# Patient Record
Sex: Male | Born: 1995 | Race: White | Hispanic: No | Marital: Single | Smoking: Never smoker
Health system: Southern US, Community
[De-identification: ages and names within clinical notes are randomized; demographics above are authoritative.]

## PROBLEM LIST (undated history)

## (undated) DIAGNOSIS — R569 Unspecified convulsions: Secondary | ICD-10-CM

---
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
## 2016-01-14 ENCOUNTER — Ambulatory Visit: Payer: BLUE CROSS/BLUE SHIELD | Admitting: Nurse Practitioner

## 2016-01-15 ENCOUNTER — Ambulatory Visit: Payer: BLUE CROSS/BLUE SHIELD | Admitting: Nurse Practitioner

## 2016-01-16 ENCOUNTER — Encounter: Payer: Self-pay | Admitting: Nurse Practitioner

## 2016-01-20 ENCOUNTER — Other Ambulatory Visit: Payer: Self-pay | Admitting: Neurology

## 2016-01-20 ENCOUNTER — Telehealth: Payer: Self-pay | Admitting: *Deleted

## 2016-01-20 NOTE — Telephone Encounter (Signed)
Called pt . Spoke to mother d/t message I received from pt re no show appt on 7/19. She said she would make appt. Pt thought appt was scheduled for 7/26 and received a no show letter. He stated he was told appt was for 7/26, not 7/19. I offered appt on 7/26 at 230, 8/3 at 12pm or 430pm. Mother declined stating pt had to work. I made appt for 02/06/16 at 730am. Mother requested earliest appt possible d/t pt work. I advised we are not going to charge pt 50 dollar no show fee since it was a mis-communication. She verbalized understanding.

## 2016-01-26 IMAGING — DX DG RIBS W/ CHEST 3+V*R*
4 series · 4 of 4 positions shown · non-contrast
Comparison: None.

CLINICAL DATA: 18-year-old male with chest pain

EXAM:
RIGHT RIBS AND CHEST - 3+ VIEW

[chest pa]
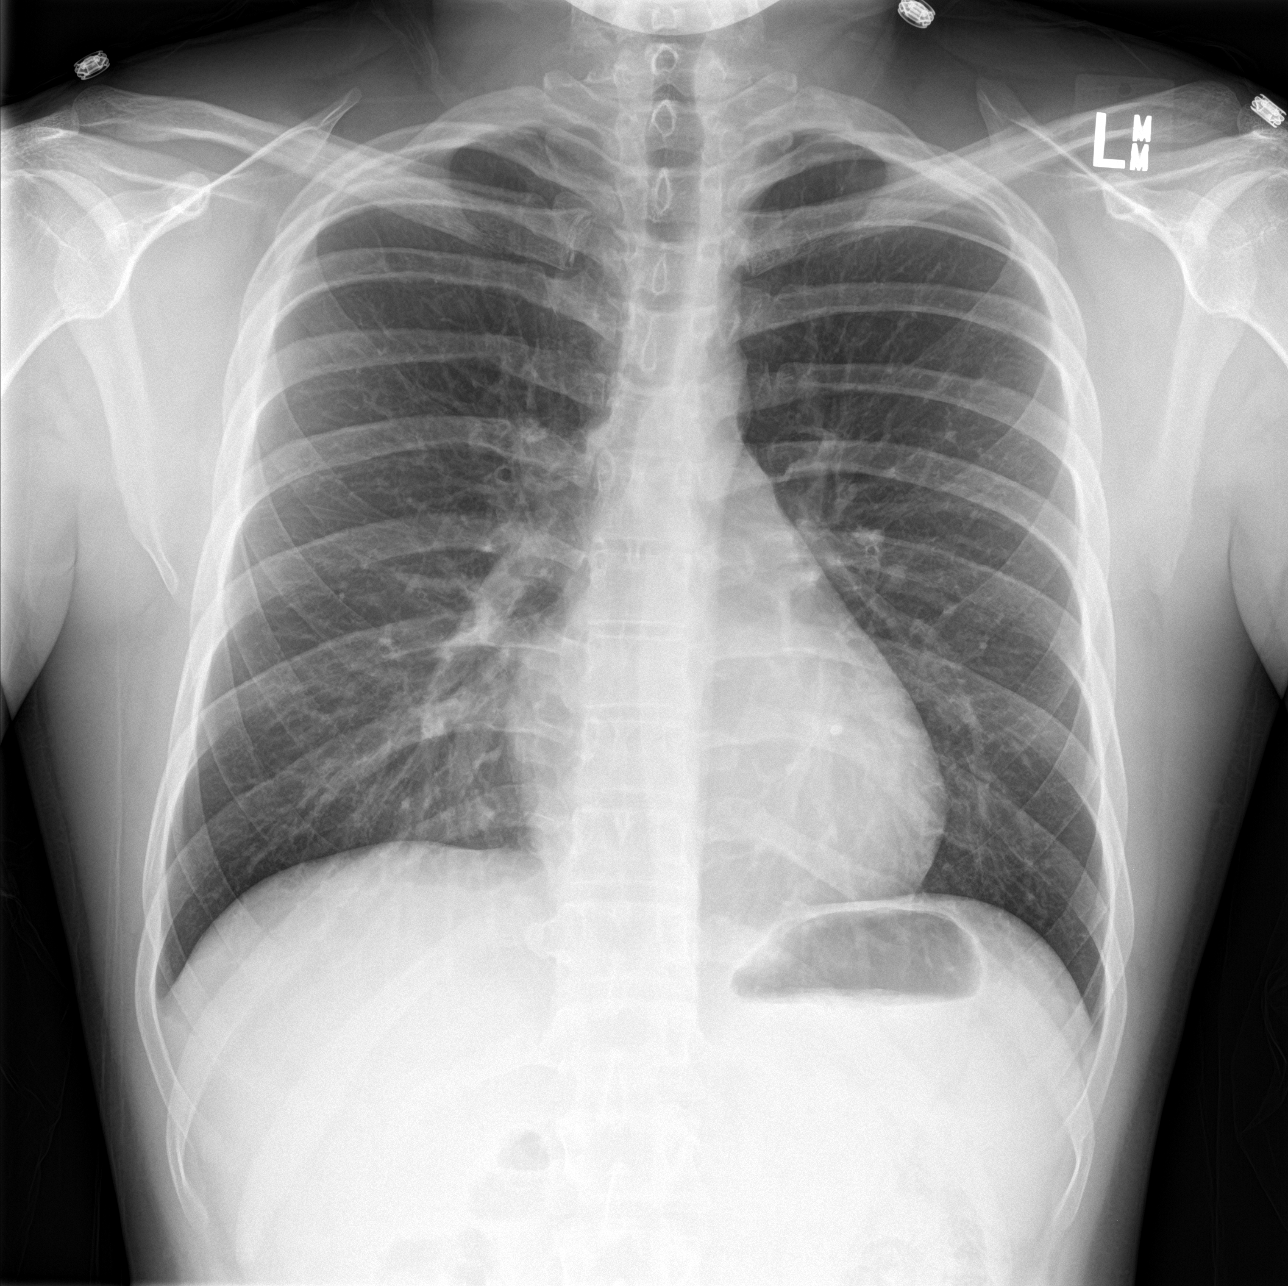

[rib pa]
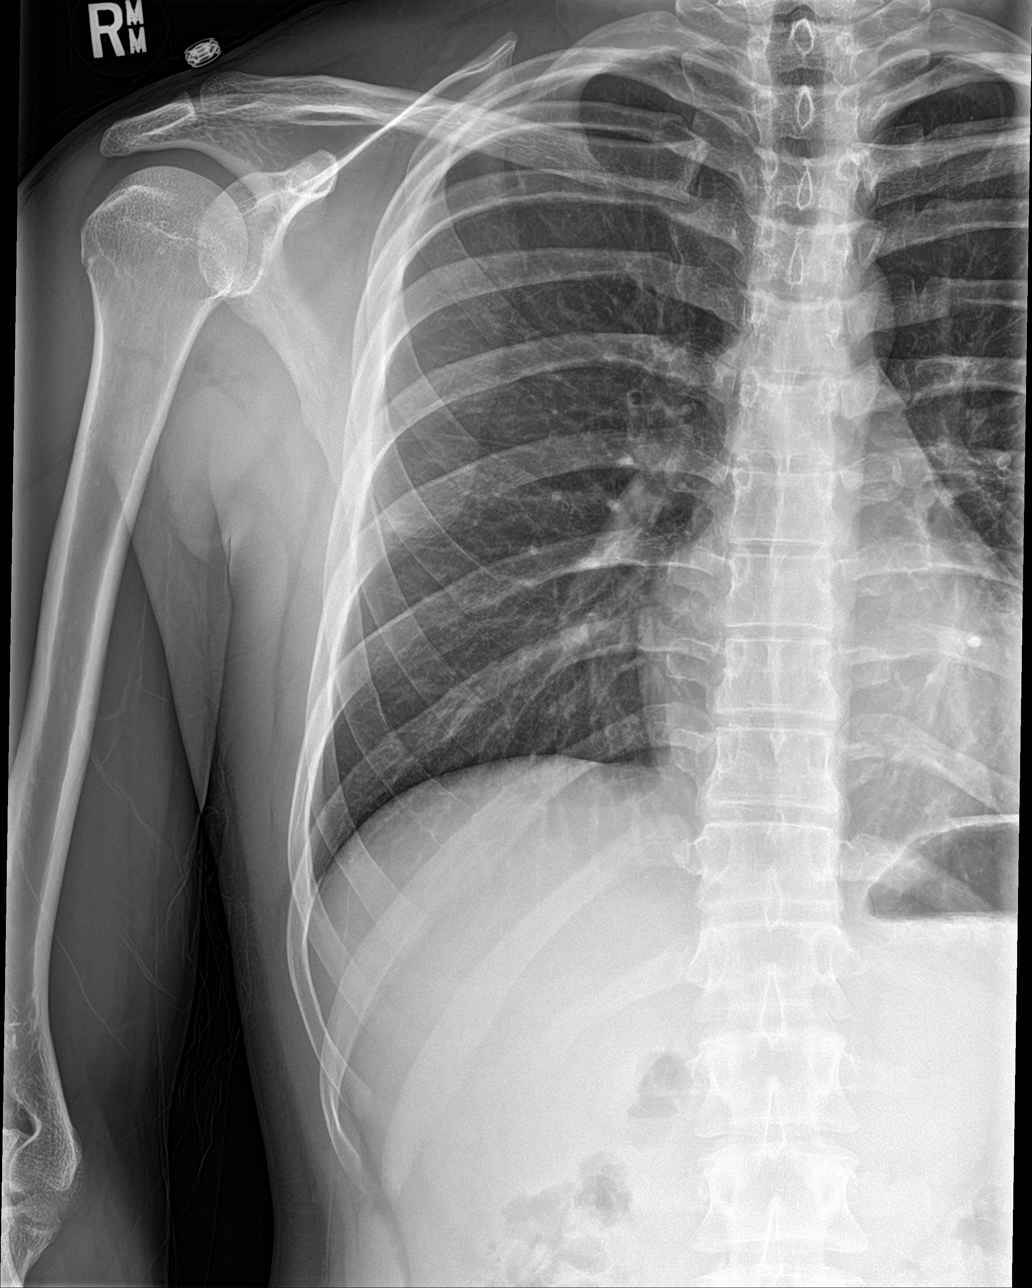

[rib pa obl (1 of 2)]
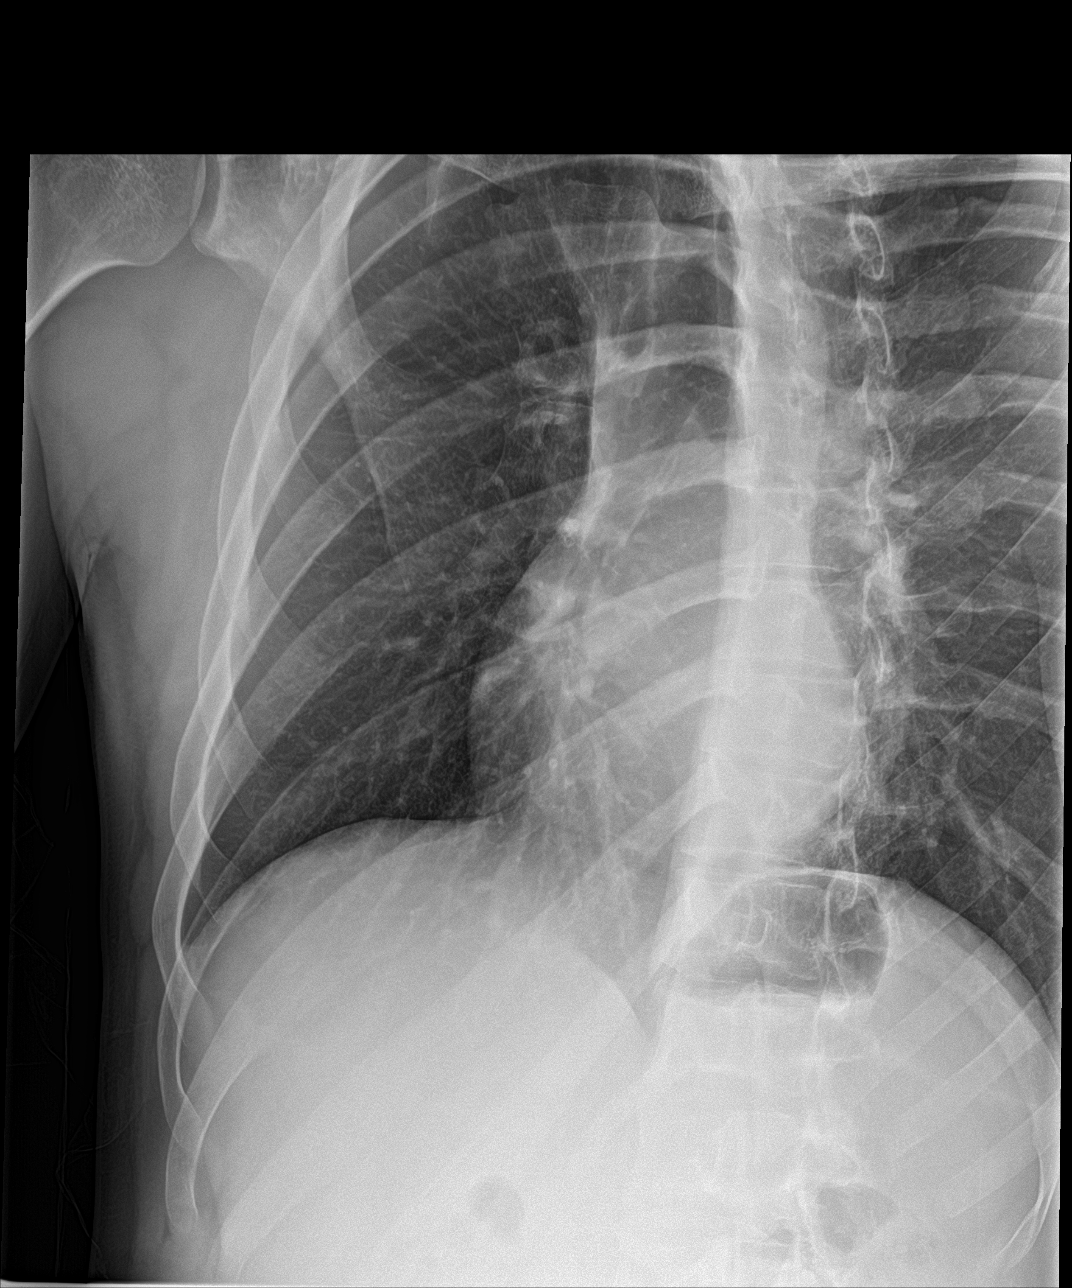

[rib pa obl (2 of 2)]
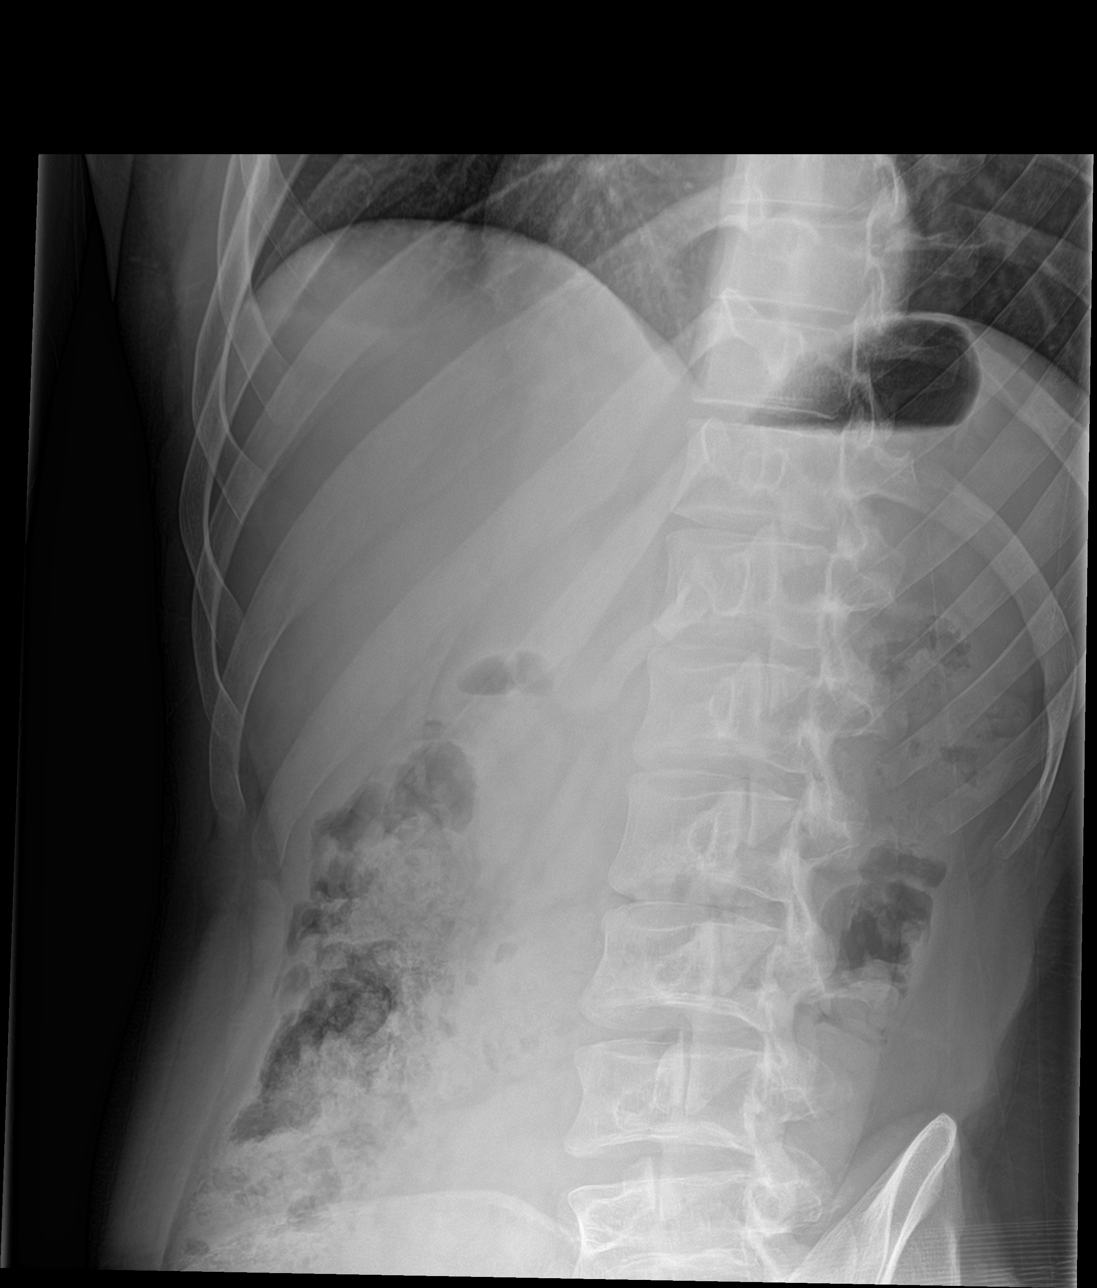

[4 of 4 positions shown; findings below may reference images not displayed]

FINDINGS: No fracture or other bone lesions are seen involving the ribs. There
is no evidence of pneumothorax or pleural effusion. Both lungs are
clear. Heart size and mediastinal contours are within normal limits.
IMPRESSION: Negative.

## 2016-02-06 ENCOUNTER — Ambulatory Visit (INDEPENDENT_AMBULATORY_CARE_PROVIDER_SITE_OTHER): Payer: BLUE CROSS/BLUE SHIELD | Admitting: Neurology

## 2016-02-06 ENCOUNTER — Encounter: Payer: Self-pay | Admitting: Neurology

## 2016-02-06 VITALS — BP 130/68 | HR 55 | Ht 68.0 in | Wt 190.2 lb

## 2016-02-06 DIAGNOSIS — R569 Unspecified convulsions: Secondary | ICD-10-CM | POA: Diagnosis not present

## 2016-02-06 DIAGNOSIS — Z79899 Other long term (current) drug therapy: Secondary | ICD-10-CM

## 2016-02-06 MED ORDER — LEVETIRACETAM ER 500 MG PO TB24: 1000.0000 mg | ORAL_TABLET | Freq: Every day | ORAL | 5 refills | Status: DC

## 2016-02-06 NOTE — Progress Notes (Signed)
[name]    Provider:  Dr James Harrell Referring Provider: Alycia Rossetti, MD Primary Care Physician:  James Blackbird, MD  CC:  seizure  Interval history: He is non compliant. He can drive September 62IR. Reviewed EEG and MRI images with patient and mother which were both normal. Discussed possibly getting a three-day EEG at this time we'll hold off. We'll repeat routine EEG in the office at next appointment. Episodes may have been provoked, patient was "vaping a lot" previous to episodes. Vaping has become very popular. Discussed that this may have been a provoking factor, I'm really unclear what is in this type of smoking supplies. Would prefer to keep patient on medication for 2 years, monitor, and discussed that a later time the possibility of taking him off the medications  HPI:  James Harrell is a 20 y.o. male here as a referral from Dr. Buelah Harrell for 2 episodes of seizure like activity. He was started on Keppra in the ED after the second event. On February 28th he passed out, he remembers everything, was not confused, but mother was called and said son had a seizure. They were about to close. He felt lightheaded, he had not eaten or drank anything that day. Mother talked to his boss and a fireman thought is looked like a seizure but no other details. He does not remember anything about the second one. They were about to close, then doesn't remember anything. He rememeber waking up in the hospital. He was found behind the counter. He was shaking  A little, eyes were open and one pupil looked bigger. Mom met him in the emergency room about 30 minutes after the incident. He seemed confused. He urinated on himself the second time. No FHx of seizures. Junior in [place], doing well, no developmental issues. No drug or alcohol use. He was started on Keppra in the ED. No preceding illnesses, nothing unusual, no new medications, never had jerking or staring spells. He has been vaping  lately.  Reviewed notes, labs and imaging from outside physicians, which showed: Was seen in the ED on 2/28. Pt states he began feeling dizzy just before leaving work. He was told that he passed out and became unresponsive, and was told by his manager that he was shaking. He states that when he woke up he knew where he was and was not confused. He denies feeling hot before or after the episode. He denies any medical problems and is not on any medications. He denies a PMHx of seizures as well as alcohol or drug use. Brought in again 3/20. pt was at work and had an unwitnessed seizure; pt was found on the floor in the bathroom by another co-worker and found unconscious but breathing; pt is only able to tell his name in the ED. Was started on Keppra '500mg'$  bid.  Ethanol and rapid drug screen negative Cbc unremarkable cmp 3/20 with creatinine 1.39 but normal creatinine 08/27/2015   CT 09/16/2015 showed No acute intracranial abnormalities including mass lesion or mass effect, hydrocephalus, extra-axial fluid collection, midline shift, hemorrhage, or acute infarction, large ischemic events (personally reviewed images)     Review of Systems: Patient complains of symptoms per HPI as well as the following symptoms:no cp, no sob. Pertinent negatives per HPI. All others negative.     Social History   Social History  . Marital status: Single    Spouse name: N/A  . Number of children: 0  . Years of education: 80  Occupational History  . [employer]    Social History Main Topics  . Smoking status: Never Smoker  . Smokeless tobacco: Not on file  . Alcohol use No  . Drug use: No  . Sexual activity: Not on file   Other Topics Concern  . Not on file   Social History Narrative   Lives with parents   Only child   Caffeine use: 2 drinks daily    Family History  Problem Relation Age of Onset  . Depression Mother   . Seizures Neg Hx     History reviewed. No pertinent past medical  history.  Past Surgical History:  Procedure Laterality Date  . HERNIA REPAIR  1999    Current Outpatient Prescriptions  Medication Sig Dispense Refill  . levETIRAcetam (KEPPRA) 500 MG tablet TAKE (1) TABLET BY MOUTH TWICE DAILY. 60 tablet 0   No current facility-administered medications for this visit.     Allergies as of 02/06/2016  . (No Known Allergies)    Vitals: Ht '5\' 8"'$  (1.727 m)   Wt 190 lb 3.2 oz (86.3 kg)   BMI 28.92 kg/m  Last Weight:  Wt Readings from Last 1 Encounters:  02/06/16 190 lb 3.2 oz (86.3 kg) (90 %, Z= 1.28)*   * Growth percentiles are based on CDC 2-20 Years data.   Last Height:   Ht Readings from Last 1 Encounters:  02/06/16 '5\' 8"'$  (1.727 m) (30 %, Z= -0.52)*   * Growth percentiles are based on CDC 2-20 Years data.    Speech:    Speech is normal; fluent and spontaneous with normal comprehension.  Cognition:    The patient is oriented to person, place, and time;     recent and remote memory intact;     language fluent;     normal attention, concentration,     fund of knowledge Cranial Nerves:    The pupils are equal, round, and reactive to light. The fundi are normal and spontaneous venous pulsations are present. Visual fields are full to finger confrontation. Extraocular movements are intact. Trigeminal sensation is intact and the muscles of mastication are normal. The face is symmetric. The palate elevates in the midline. Hearing intact. Voice is normal. Shoulder shrug is normal. The tongue has normal motion without fasciculations.   Coordination:    Normal finger to nose and heel to shin. Normal rapid alternating movements.   Gait:    Heel-toe and tandem gait are normal.   Motor Observation:    No asymmetry, no atrophy, and no involuntary movements noted. Tone:    Normal muscle tone.    Posture:    Posture is normal. normal erect    Strength:    Strength is V/V in the upper and lower limbs.      Sensation: intact to LT       Reflex Exam:  DTR's:    Deep tendon reflexes in the upper and lower extremities are normal bilaterally.   Toes:    The toes are downgoing bilaterally.   Clonus:    Clonus is absent.       Assessment/Plan:  20 year old with no significant PMHx here for 2 episodes of LOC with shaking, seizure-like episodes. Patient was "Vaping a lot" prior to these episodes, unclear if related but may have been provoked.   Continue Keppra '500mg'$  bid MRI brain, routine eeg normal Patient is unable to drive, operate heavy machinery, perform activities at heights or participate in water activities until 6  months seizure free  Discussed plan with mother and son and etiology of seizures which are often idiopathic Will continue Keppra for minimum 2 years.  Repeat labs today Do not skip medications, discuss factors that decrease seizure threshold such as missing medication, illness and fevers and others.  Sarina Ill, MD  [facility] [address] [place], [place] [address]  Phone [phone] Fax [phone] A total of 30 minutes was spent face-to-face with this patient. Over half this time was spent on counseling patient on the seizure diagnosis and different diagnostic and therapeutic options available.

## 2016-02-06 NOTE — Patient Instructions (Signed)
Remember to drink plenty of fluid, eat healthy meals and do not skip any meals. Try to eat protein with a every meal and eat a healthy snack such as fruit or nuts in between meals. Try to keep a regular sleep-wake schedule and try to exercise daily, particularly in the form of walking, 20-30 minutes a day, if you can.   As far as your medications are concerned, I would like to suggest: Keppra XR 2 pills in the evening.  As far as diagnostic testing: Labs. Will repeat EEG at next appointment  I would like to see you back in 6 months, sooner if we need to. Please call us with any interim questions, concerns, problems, updates or refill requests.   Our phone number is [phone]. We also have an after hours call service for urgent matters and there is a physician on-call for urgent questions. For any emergencies you know to call 911 or go to the nearest emergency room

## 2016-02-07 ENCOUNTER — Telehealth: Payer: Self-pay | Admitting: *Deleted

## 2016-02-07 LAB — CBC
Hematocrit: 46.4 % (ref 37.5–51.0)
Hemoglobin: 15.6 g/dL (ref 12.6–17.7)
MCH: 29.1 pg (ref 26.6–33.0)
MCHC: 33.6 g/dL (ref 31.5–35.7)
MCV: 86 fL (ref 79–97)
PLATELETS: 319 10*3/uL (ref 150–379)
RBC: 5.37 x10E6/uL (ref 4.14–5.80)
RDW: 13.4 % (ref 12.3–15.4)
WBC: 6.4 10*3/uL (ref 3.4–10.8)

## 2016-02-07 LAB — COMPREHENSIVE METABOLIC PANEL
ALBUMIN: 4.4 g/dL (ref 3.5–5.5)
ALK PHOS: 76 IU/L (ref 56–127)
ALT: 22 IU/L (ref 0–44)
AST: 14 IU/L (ref 0–40)
Albumin/Globulin Ratio: 2 (ref 1.2–2.2)
BILIRUBIN TOTAL: 0.8 mg/dL (ref 0.0–1.2)
BUN / CREAT RATIO: 7 — AB (ref 9–20)
BUN: 8 mg/dL (ref 6–20)
CHLORIDE: 102 mmol/L (ref 96–106)
CO2: 26 mmol/L (ref 18–29)
Calcium: 9.4 mg/dL (ref 8.7–10.2)
Creatinine, Ser: 1.12 mg/dL (ref 0.76–1.27)
GFR calc non Af Amer: 95 mL/min/{1.73_m2} (ref >59)
GFR, EST AFRICAN AMERICAN: 110 mL/min/{1.73_m2} (ref >59)
GLOBULIN, TOTAL: 2.2 g/dL (ref 1.5–4.5)
GLUCOSE: 80 mg/dL (ref 65–99)
Potassium: 4.3 mmol/L (ref 3.5–5.2)
SODIUM: 142 mmol/L (ref 134–144)
TOTAL PROTEIN: 6.6 g/dL (ref 6.0–8.5)

## 2016-02-07 NOTE — Telephone Encounter (Signed)
LMVM for pt home (DPR) that labs normal.  He is to call back if questions.

## 2016-02-07 NOTE — Telephone Encounter (Signed)
-----   Message from Anson FretAntonia B Ahern, MD sent at 02/07/2016 10:44 AM EDT ----- Patient's labs normal thanks

## 2016-02-09 ENCOUNTER — Encounter: Payer: Self-pay | Admitting: Neurology

## 2016-05-08 ENCOUNTER — Emergency Department (HOSPITAL_COMMUNITY)
Admission: EM | Admit: 2016-05-08 | Discharge: 2016-05-09 | Disposition: A | Discharge status: Home / Self Care | Payer: BLUE CROSS/BLUE SHIELD | Attending: Emergency Medicine | Admitting: Emergency Medicine

## 2016-05-08 ENCOUNTER — Encounter (HOSPITAL_COMMUNITY): Payer: Self-pay

## 2016-05-08 DIAGNOSIS — Z79899 Other long term (current) drug therapy: Secondary | ICD-10-CM | POA: Diagnosis not present

## 2016-05-08 DIAGNOSIS — G40909 Epilepsy, unspecified, not intractable, without status epilepticus: Secondary | ICD-10-CM | POA: Insufficient documentation

## 2016-05-08 DIAGNOSIS — R569 Unspecified convulsions: Secondary | ICD-10-CM

## 2016-05-08 HISTORY — DX: Unspecified convulsions: R56.9

## 2016-05-08 LAB — BASIC METABOLIC PANEL
ANION GAP: 6 (ref 5–15)
BUN: 15 mg/dL (ref 6–20)
CALCIUM: 8.8 mg/dL — AB (ref 8.9–10.3)
CO2: 22 mmol/L (ref 22–32)
CREATININE: 0.94 mg/dL (ref 0.61–1.24)
Chloride: 111 mmol/L (ref 101–111)
GLUCOSE: 81 mg/dL (ref 65–99)
Potassium: 3.6 mmol/L (ref 3.5–5.1)
Sodium: 139 mmol/L (ref 135–145)

## 2016-05-08 LAB — CBC WITH DIFFERENTIAL/PLATELET
BASOS ABS: 0 10*3/uL (ref 0.0–0.1)
BASOS PCT: 0 %
EOS PCT: 1 %
Eosinophils Absolute: 0.1 10*3/uL (ref 0.0–0.7)
HCT: 41.9 % (ref 39.0–52.0)
Hemoglobin: 15.2 g/dL (ref 13.0–17.0)
Lymphocytes Relative: 23 %
Lymphs Abs: 1.9 10*3/uL (ref 0.7–4.0)
MCH: 29.7 pg (ref 26.0–34.0)
MCHC: 36.3 g/dL — AB (ref 30.0–36.0)
MCV: 82 fL (ref 78.0–100.0)
MONO ABS: 0.7 10*3/uL (ref 0.1–1.0)
MONOS PCT: 9 %
Neutro Abs: 5.5 10*3/uL (ref 1.7–7.7)
Neutrophils Relative %: 67 %
PLATELETS: 311 10*3/uL (ref 150–400)
RBC: 5.11 MIL/uL (ref 4.22–5.81)
RDW: 11.6 % (ref 11.5–15.5)
WBC: 8.2 10*3/uL (ref 4.0–10.5)

## 2016-05-08 MED ORDER — LORAZEPAM 2 MG/ML IJ SOLN
2.0000 mg | Freq: Once | INTRAMUSCULAR | Status: AC
  Administered 2016-05-08: 2 mg via INTRAVENOUS

## 2016-05-08 MED ORDER — SODIUM CHLORIDE 0.9 % IV SOLN
1000.0000 mL | Freq: Once | INTRAVENOUS | Status: AC
  Administered 2016-05-08: 1000 mL via INTRAVENOUS

## 2016-05-08 NOTE — ED Notes (Signed)
No seizure pads to be found even called ICU. Had pads out of pillows and sheets

## 2016-05-08 NOTE — ED Provider Notes (Signed)
[facility]-EMERGENCY DEPT Provider Note   CSN: [id number] Arrival date & time: 05/08/16  2030  By signing my name below, I, Modena JanskyAlbert Thayil, attest that this documentation has been prepared under the direction and in the presence of Donnetta HutchingBrian Janne Faulk, MD . Electronically Signed: Modena JanskyAlbert Thayil, Scribe. 05/08/2016. 8:34 PM.  History   Chief Complaint Chief Complaint  Patient presents with  . Seizures   The history is provided by the patient and a parent. The history is limited by the condition of the patient. No language interpreter was used.   LEVEL 5 CAVEAT DUE TO POST-ICTAL STATE  HPI Comments: James Harrell is a 20 y.o. male with a hx of seizures who presents to the Emergency Department complaining of a seizure that started PTA. He states he recalls feeling dizzy then waking up in the ED. Pt's mother reports that he was post-ictal at the scene. She states that pt is on Keppra 1000mg  daily. He has been non-compliant with his seizure medication recently. Mother reports that pt's last seizure was about 8 months ago. Pt denies any drug or alcohol use.   Neurologist: [facility]   PCP: Milinda AntisURHAM, KAWANTA, MD  Past Medical History:  Diagnosis Date  . Seizures [facility](HCC)     Patient Active Problem List   Diagnosis Date Noted  . Generalized seizure (HCC) 09/19/2015  . Routine infant or child health check 05/09/2013    Past Surgical History:  Procedure Laterality Date  . HERNIA REPAIR  1999       Home Medications    Prior to Admission medications   Medication Sig Start Date End Date Taking? Authorizing Provider  levETIRAcetam (KEPPRA) 500 MG tablet Take 500 mg by mouth 2 (two) times daily.   Yes Historical Provider, MD  levETIRAcetam (KEPPRA XR) 500 MG 24 hr tablet Take 2 tablets (1,000 mg total) by mouth daily. 02/06/16   Anson FretAntonia B Ahern, MD    Family History Family History  Problem Relation Age of Onset  . Depression Mother   . Seizures Neg Hx     Social History Social  History  Substance Use Topics  . Smoking status: Never Smoker  . Smokeless tobacco: Not on file  . Alcohol use No     Allergies   Patient has no known allergies.   Review of Systems Review of Systems  Unable to perform ROS: Acuity of condition    Physical Exam Updated Vital Signs BP (!) 146/52 (BP Location: Right Arm)   Pulse (!) 123   Temp 97.6 F (36.4 C) (Oral)   Resp 18   SpO2 100%   Physical Exam  Constitutional: He is oriented to person, place, and time. He appears well-developed and well-nourished.  HENT:  Head: Normocephalic and atraumatic.  Eyes: Conjunctivae are normal.  Neck: Neck supple.  Cardiovascular: Normal rate and regular rhythm.   Pulmonary/Chest: Effort normal and breath sounds normal.  Abdominal: Soft. Bowel sounds are normal.  Musculoskeletal: Normal range of motion.  Neurological: He is alert and oriented to person, place, and time.  Pt appears mildly confused.   Skin: Skin is warm and dry.  Psychiatric: He has a normal mood and affect. His behavior is normal.  Nursing note and vitals reviewed.    ED Treatments / Results  DIAGNOSTIC STUDIES: Oxygen Saturation is 100% on RA, normal by my interpretation.    COORDINATION OF CARE: 8:38 PM- Pt advised of plan for treatment, which includes IV fluids, Ativan medication, and monitoring of activity in the ED.  Pt and pt's mother agree with treatment plan.  Labs (all labs ordered are listed, but only abnormal results are displayed) Labs Reviewed  CBC WITH DIFFERENTIAL/PLATELET - Abnormal; Notable for the following:       Result Value   MCHC 36.3 (*)    All other components within normal limits  BASIC METABOLIC PANEL - Abnormal; Notable for the following:    Calcium 8.8 (*)    All other components within normal limits    EKG  EKG Interpretation None       Radiology No results found.  Procedures Procedures (including critical care time)  Medications Ordered in ED Medications    LORazepam (ATIVAN) injection 2 mg (2 mg Intravenous Given 05/08/16 2048)  0.9 %  sodium chloride infusion (0 mLs Intravenous Stopped 05/08/16 2135)  LORazepam (ATIVAN) injection 2 mg (2 mg Intravenous Given 05/08/16 2058)     Initial Impression / Assessment and Plan / ED Course  I have reviewed the triage vital signs and the nursing notes.  Pertinent labs & imaging results that were available during my care of the patient were reviewed by me and considered in my medical decision making (see chart for details).  Clinical Course     This is the patient's second seizure. He has not been taking his medication correctly. These issues were discussed with the patient and his mother. He has a prescription for Keppra. Follow-up with neurology.  Final Clinical Impressions(s) / ED Diagnoses   Final diagnoses:  Seizure [facility](HCC)    New Prescriptions New Prescriptions   No medications on file   I personally performed the services described in this documentation, which was scribed in my presence. The recorded information has been reviewed and is accurate.      Donnetta HutchingBrian Bianka Liberati, MD 05/08/16 [phone]

## 2016-05-08 NOTE — ED Triage Notes (Signed)
Pt with history of seizures, had a witnessed seizure tonight at work.  Pt is post ictal, awake and alert, confused as to details

## 2016-05-08 NOTE — Discharge Instructions (Signed)
You must take your seizure medication. Try to eat well and get adequate sleep. No driving until cleared by neurologist.

## 2016-05-08 NOTE — ED Notes (Signed)
Pt given a Malaw[place]urkey sandwich, peanut butter crackers, and a coke.

## 2016-05-11 IMAGING — DX DG SHOULDER 2+V*R*
2 series · 2 of 2 positions shown · non-contrast
Comparison: None.

CLINICAL DATA: Seizure today.

EXAM:
RIGHT SHOULDER - 2+ VIEW

[shoulder grashey]
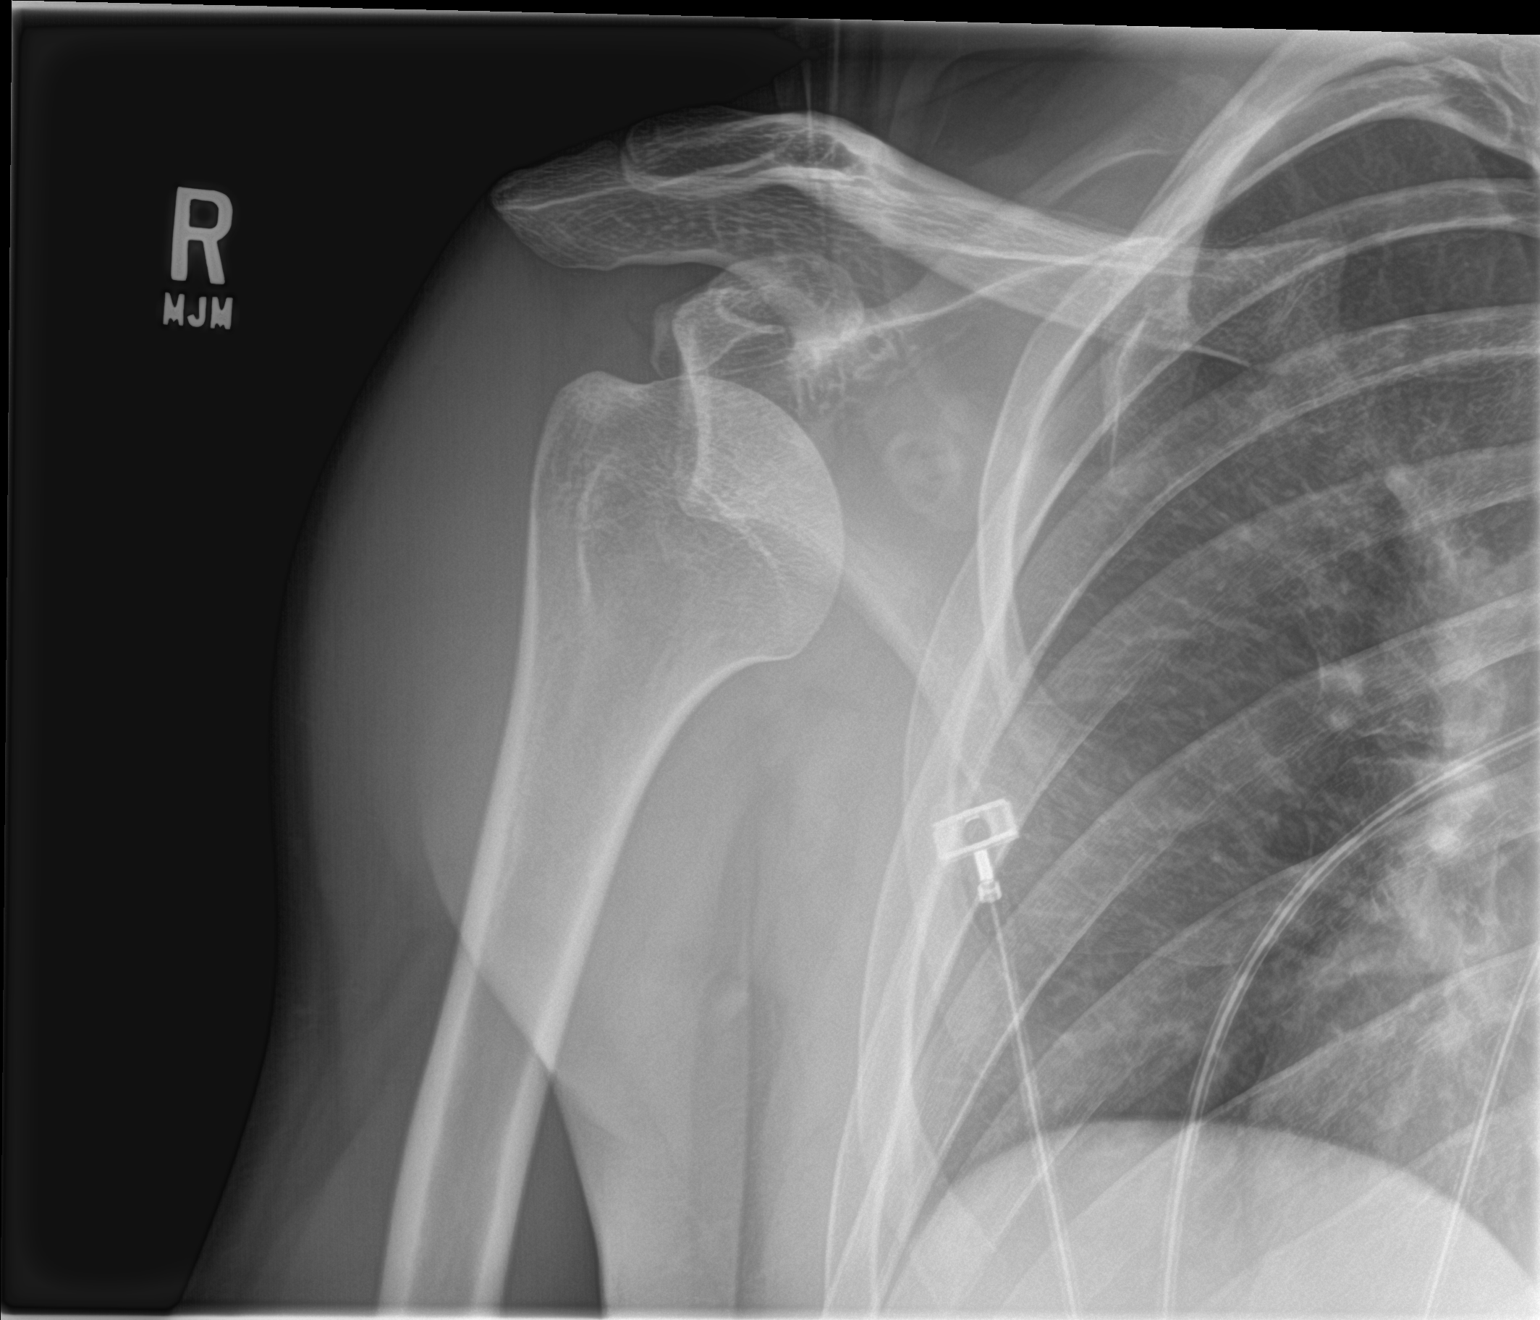

[shoulder y view]
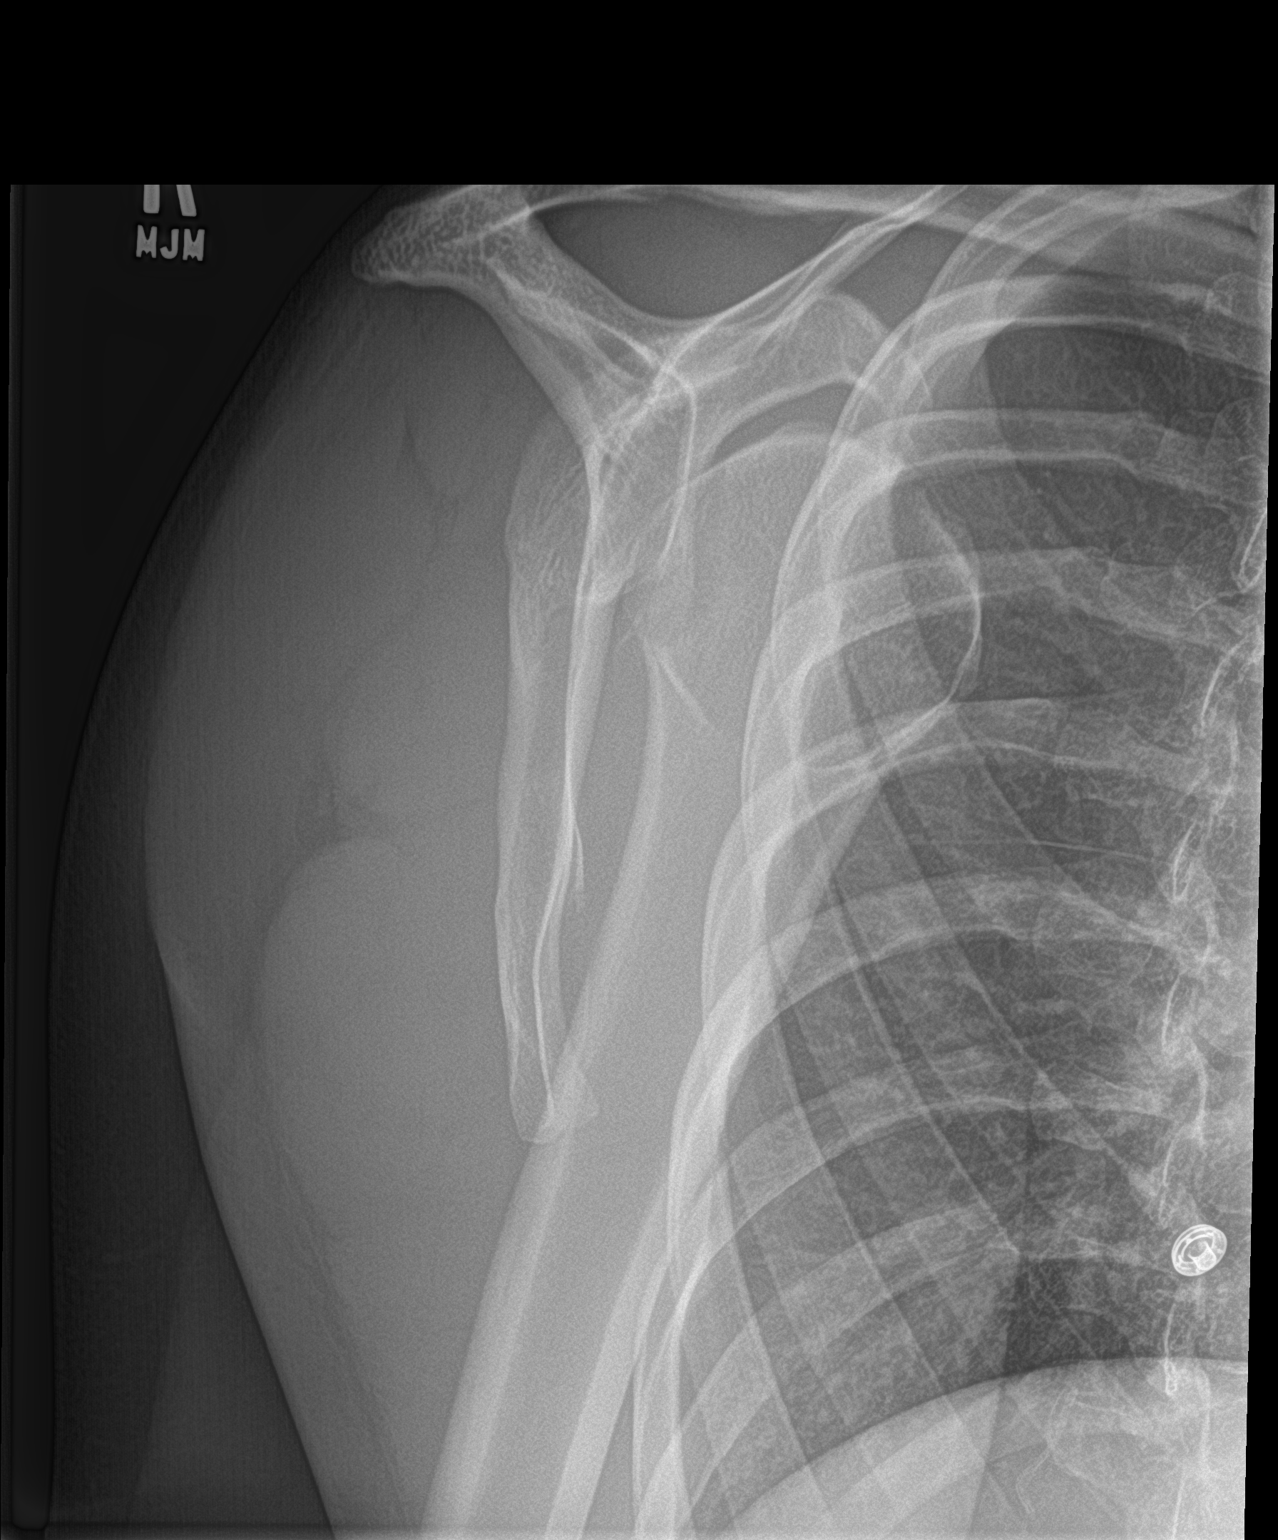

[2 of 2 positions shown; findings below may reference images not displayed]

FINDINGS: There is an anterior subcoracoid glenohumeral dislocation. No
fracture is evident.
IMPRESSION: Right shoulder dislocation

## 2016-05-12 ENCOUNTER — Encounter: Payer: Self-pay | Admitting: Family Medicine

## 2016-05-12 ENCOUNTER — Ambulatory Visit (INDEPENDENT_AMBULATORY_CARE_PROVIDER_SITE_OTHER): Payer: BLUE CROSS/BLUE SHIELD | Admitting: Family Medicine

## 2016-05-12 VITALS — BP 118/66 | HR 76 | Temp 98.4°F | Resp 14 | Ht 68.0 in | Wt 203.0 lb

## 2016-05-12 DIAGNOSIS — Z6830 Body mass index (BMI) 30.0-30.9, adult: Secondary | ICD-10-CM

## 2016-05-12 DIAGNOSIS — Z1321 Encounter for screening for nutritional disorder: Secondary | ICD-10-CM

## 2016-05-12 DIAGNOSIS — E6609 Other obesity due to excess calories: Secondary | ICD-10-CM | POA: Diagnosis not present

## 2016-05-12 DIAGNOSIS — R569 Unspecified convulsions: Secondary | ICD-10-CM | POA: Diagnosis not present

## 2016-05-12 DIAGNOSIS — E669 Obesity, unspecified: Secondary | ICD-10-CM | POA: Insufficient documentation

## 2016-05-12 LAB — COMPREHENSIVE METABOLIC PANEL
ALT: 21 U/L (ref 8–46)
AST: 18 U/L (ref 12–32)
Albumin: 4.6 g/dL (ref 3.6–5.1)
Alkaline Phosphatase: 65 U/L (ref 48–230)
BILIRUBIN TOTAL: 0.5 mg/dL (ref 0.2–1.1)
BUN: 12 mg/dL (ref 7–20)
CHLORIDE: 105 mmol/L (ref 98–110)
CO2: 26 mmol/L (ref 20–31)
Calcium: 9.6 mg/dL (ref 8.9–10.4)
Creat: 0.95 mg/dL (ref 0.60–1.26)
GLUCOSE: 76 mg/dL (ref 70–99)
Potassium: 4.2 mmol/L (ref 3.8–5.1)
SODIUM: 141 mmol/L (ref 135–146)
Total Protein: 6.8 g/dL (ref 6.3–8.2)

## 2016-05-12 LAB — HEMOGLOBIN A1C
Hgb A1c MFr Bld: 4.7 % (ref <5.7)
Mean Plasma Glucose: 88 mg/dL

## 2016-05-12 LAB — VITAMIN B12: VITAMIN B 12: 649 pg/mL (ref 200–1100)

## 2016-05-12 LAB — TSH: TSH: 2.61 mIU/L (ref 0.50–4.30)

## 2016-05-12 NOTE — Progress Notes (Signed)
Subjective:    Patient ID: James Harrell, male    DOB: Nov 03, 1995, 20 y.o.   MRN: [id number]  Patient presents for ER F/U (seizures)   Patient here for ER follow-up. I last saw him in November 2014. He's been followed by his neurologist Dr. Lucia GaskinsAhern for seizure disorder. He's been to the emergency room multiple times over this past year secondary to seizures. His last ER visit was 4 days ago. He is noncompliant with his Keppra prescribed by neurology. Labs in the emergency room were unremarkable his only abnormality was a calcium of 8.8 which is barely below normal. His last MRI of the brain was in April 2017 by neurology. His last neurology visit was in August 2017, at that time it was recommended that he not operate any heavy machinery and that he not participate in the water activities that he not drive due to the possibility of having a seizure while doing these activities.  He admits to not taking his medication regulary, he now started the Keppra extended release Saturday. He is here today with his mother. They are still frustrated not know the cause of his seizures. They typically occur while he is at work around [employer]8pm, he works at [employer], states its boring there, no stress, but he gets along with everyone. He doesn't feel anxious.  Mother would like some other labs drawn as well and referral to see nutritionist to work on diet and he has gained weight   Mother is pt of mine- she is endocrinology nurse   Review Of Systems:  GEN- denies fatigue, fever, weight loss,weakness, recent illness HEENT- denies eye drainage, change in vision, nasal discharge, CVS- denies chest pain, palpitations RESP- denies SOB, cough, wheeze ABD- denies N/V, change in stools, abd pain GU- denies dysuria, hematuria, dribbling, incontinence MSK- denies joint pain, muscle aches, injury Neuro- denies headache, dizziness, syncope, +seizure activity       Objective:    BP 118/66 (BP Location: Left Arm,  Patient Position: Sitting, Cuff Size: Large)   Pulse 76   Temp 98.4 F (36.9 C) (Oral)   Resp 14   Ht 5\' 8"  (1.727 m)   Wt 203 lb (92.1 kg)   SpO2 99%   BMI 30.87 kg/m  GEN- NAD, alert and oriented x3 HEENT- PERRL, EOMI, non injected sclera, pink conjunctiva, MMM, oropharynx clear Neck- Supple, no thyromegaly CVS- RRR, no murmur RESP-CTAB Neuro-CNII-XII in tact no deficits  EXT- No edema Pulses- Radial, DP- 2+        Assessment & Plan:      Problem List Items Addressed This Visit    Obesity   Relevant Orders   Ambulatory referral to diabetic education   Generalized seizure (HCC) - Primary    Continue extended release Keppra discussed importance of taking his medication on a regular basis to prevent seizures. He is also still on restrictions. Recheck other labs including TSH and B-12 A1c he is a little overweight we'll get him with the nutritionist as well. I cannot make out why he only has seizures at work and at 8:00 PM he is not having them enough to truly consider hospital monitoring with EEG as they only occur every few months and typically is because he has not taken his medicine. He does not seem overly anxious they do not appear to be somatoform      Relevant Orders   TSH   Comprehensive metabolic panel   Hemoglobin A1c   Vitamin B12  Other Visit Diagnoses    Encounter for vitamin deficiency screening       Relevant Orders   Vitamin D, 25-hydroxy      Note: This dictation was prepared with Dragon dictation along with smaller phrase technology. Any transcriptional errors that result from this process are unintentional.

## 2016-05-12 NOTE — Assessment & Plan Note (Signed)
Continue extended release Keppra discussed importance of taking his medication on a regular basis to prevent seizures. He is also still on restrictions. Recheck other labs including TSH and B-12 A1c he is a little overweight we'll get him with the nutritionist as well. I cannot make out why he only has seizures at work and at 8:00 PM he is not having them enough to truly consider hospital monitoring with EEG as they only occur every few months and typically is because he has not taken his medicine. He does not seem overly anxious they do not appear to be somatoform

## 2016-05-13 LAB — VITAMIN D 25 HYDROXY (VIT D DEFICIENCY, FRACTURES): Vit D, 25-Hydroxy: 36 ng/mL (ref 30–100)

## 2016-06-03 ENCOUNTER — Ambulatory Visit: Payer: BLUE CROSS/BLUE SHIELD | Admitting: Nutrition

## 2016-08-10 ENCOUNTER — Ambulatory Visit: Payer: BLUE CROSS/BLUE SHIELD | Admitting: Neurology

## 2016-08-10 ENCOUNTER — Telehealth: Payer: Self-pay | Admitting: *Deleted

## 2016-08-10 NOTE — Telephone Encounter (Signed)
No showed follow up appointment. 

## 2016-08-14 ENCOUNTER — Encounter: Payer: Self-pay | Admitting: Neurology

## 2016-11-27 ENCOUNTER — Ambulatory Visit: Payer: Self-pay | Admitting: Family Medicine

## 2016-12-11 ENCOUNTER — Telehealth: Payer: Self-pay | Admitting: *Deleted

## 2016-12-11 DIAGNOSIS — G40909 Epilepsy, unspecified, not intractable, without status epilepticus: Secondary | ICD-10-CM

## 2016-12-11 NOTE — Telephone Encounter (Signed)
Received call from patient mother, James BattenKim.  Requested referral to Dr. Adrian Prowsonnquah for neurology. States that [facility] is [facility], buit feels that this is too far to drive.   Ok to place referral?

## 2016-12-11 NOTE — Telephone Encounter (Signed)
Okay to send referral - Seizure disorder

## 2016-12-11 NOTE — Telephone Encounter (Signed)
Referral/orders sent.

## 2017-02-18 ENCOUNTER — Other Ambulatory Visit: Payer: Self-pay | Admitting: Neurology

## 2017-02-19 NOTE — Telephone Encounter (Signed)
Spoke to pt and he needs refill for his keppra.  Taking 1000mg  po daily.  He is looking for a neurologist closer to home.  (Dr. Gerilyn Pilgrim).  Looks like referral placed by pcp.  He said his mother was taking care of this and he was not aware if had an appt yet.   I gave him refill x2 to get to an appt.   If additional questions he said to call his mother.

## 2017-02-25 ENCOUNTER — Ambulatory Visit: Payer: BLUE CROSS/BLUE SHIELD | Admitting: Nurse Practitioner

## 2017-04-05 NOTE — Progress Notes (Signed)
[facility]  PATIENT: James Harrell DOB: [birth year]   REASON FOR VISIT: Follow-up for seizure disorder HISTORY FROM: Patient    HISTORY OF PRESENT ILLNESS UPDATE 10/09/2018CM  James Harrell, 21 year old male returns for follow-up with history of seizure disorder. He had a visit to the emergency room 05/08/2016 for seizure. He had been noncompliant with his medication. According to the patient today this was his last seizure. He remains on Keppra XR 500 mg tablets  2 daily. He denies missing any doses of his medication. MRI and EEG in the past normal. He returns for reevaluation  02/06/16 AAInterval history: He is non compliant. He can drive September 97QB. Reviewed EEG and MRI images with patient and mother which were both normal. Discussed possibly getting a three-day EEG at this time we'll hold off. We'll repeat routine EEG in the office at next appointment. Episodes may have been provoked, patient was "vaping a lot" previous to episodes. Vaping has become very popular. Discussed that this may have been a provoking factor, I'm really unclear what is in this type of smoking supplies. Would prefer to keep patient on medication for 2 years, monitor, and discussed that a later time the possibility of taking him off the medications  HAL:PFXTKW James Harrell a 21 y.o.malehere as a referral from Dr. Vidal Schwalbe 2 episodes of seizure like activity. He was started on Keppra in the ED after the second event. On February 28th he passed out, he remembers everything, was not confused, but mother was called and said son had a seizure. They were about to close. He felt lightheaded, he had not eaten or drank anything that day. Mother talked to his boss and a fireman thought is looked like a seizure but no other details. He does not remember anything about the second one. They were about to close, then doesn't remember anything. He rememeber waking up in the hospital. He was found behind the  counter. He was shaking A little, eyes were open and one pupil looked bigger. Mom met him in the emergency room about 30 minutes after the incident. He seemed confused. He urinated on himself the second time. No FHx of seizures. Junior in [place], doing well, no developmental issues. No drug or alcohol use. He was started on Keppra in the ED. No preceding illnesses, nothing unusual, no new medications, never had jerking or staring spells. He has been vaping lately.   REVIEW OF SYSTEMS: Full 14 system review of systems performed and notable only for those listed, all others are neg:  Constitutional: neg  Cardiovascular: neg Ear/Nose/Throat: neg  Skin: neg Eyes: neg Respiratory: neg Gastroitestinal: neg  Hematology/Lymphatic: neg  Endocrine: neg Musculoskeletal:neg Allergy/Immunology: neg Neurological: Seizure disorder  Psychiatric: neg Sleep : neg   ALLERGIES: No Known Allergies  HOME MEDICATIONS: Outpatient Medications Prior to Visit  Medication Sig Dispense Refill  . levETIRAcetam (KEPPRA XR) 500 MG 24 hr tablet TAKE (2) TABLETS BY MOUTH ONCE DAILY. 60 tablet 1   No facility-administered medications prior to visit.     PAST MEDICAL HISTORY: Past Medical History:  Diagnosis Date  . Seizures ([place])     PAST SURGICAL HISTORY: Past Surgical History:  Procedure Laterality Date  . HERNIA REPAIR  1999    FAMILY HISTORY: Family History  Problem Relation Age of Onset  . Depression Mother   . Seizures Neg Hx     SOCIAL HISTORY: Social History   Social History  . Marital status: Single    Spouse name:  N/A  . Number of children: 0  . Years of education: 12   Occupational History  . [employer]    Social History Main Topics  . Smoking status: Never Smoker  . Smokeless tobacco: Never Used  . Alcohol use No  . Drug use: No  . Sexual activity: No   Other Topics Concern  . Not on file   Social History Narrative   Lives with parents   Only child   Caffeine use: 2  drinks daily     PHYSICAL EXAM  Vitals:   04/06/17 0725  BP: 121/76  Pulse: 71  Weight: 208 lb (94.3 kg)  Height: 5\' 8"  (1.727 m)   Body mass index is 31.63 kg/m.  Generalized: Well developed, in no acute distress  Head: normocephalic and atraumatic,. Oropharynx benign  Neck: Supple,  Musculoskeletal: No deformity   Neurological examination   Mentation: Alert oriented to time, place, history taking. Attention span and concentration appropriate. Recent and remote memory intact.  Follows all commands speech and language fluent.   Cranial nerve II-XII: Fundoscopic exam reveals Harrell disc margins.Pupils were equal round reactive to light extraocular movements were full, visual field were full on confrontational test. Facial sensation and strength were normal. hearing was intact to finger rubbing bilaterally. Uvula tongue midline. head turning and shoulder shrug were normal and symmetric.Tongue protrusion into cheek strength was normal. Motor: normal bulk and tone, full strength in the BUE, BLE, fine finger movements normal, no pronator drift. No focal weakness Sensory: normal and symmetric to light touch, in the upper and lower extremities  Coordination: finger-nose-finger, heel-to-shin bilaterally, no dysmetria Reflexes: Brachioradialis 2/2, biceps 2/2, triceps 2/2, patellar 2/2, Achilles 2/2, plantar responses were flexor bilaterally. Gait and Station: Rising up from seated position without assistance, normal stance,  moderate stride, good arm swing, smooth turning, able to perform tiptoe, and heel walking without difficulty. Tandem gait is steady  DIAGNOSTIC DATA (LABS, IMAGING, TESTING) - I reviewed patient records, labs, notes, testing and imaging myself where available.  Lab Results  Component Value Date   WBC 8.2 05/08/2016   HGB 15.2 05/08/2016   HCT 41.9 05/08/2016   MCV 82.0 05/08/2016   PLT 311 05/08/2016      Component Value Date/Time   NA 141 05/12/2016 1426    NA 142 02/06/2016 0911   K 4.2 05/12/2016 1426   CL 105 05/12/2016 1426   CO2 26 05/12/2016 1426   GLUCOSE 76 05/12/2016 1426   BUN 12 05/12/2016 1426   BUN 8 02/06/2016 0911   CREATININE 0.95 05/12/2016 1426   CALCIUM 9.6 05/12/2016 1426   PROT 6.8 05/12/2016 1426   PROT 6.6 02/06/2016 0911   ALBUMIN 4.6 05/12/2016 1426   ALBUMIN 4.4 02/06/2016 0911   AST 18 05/12/2016 1426   ALT 21 05/12/2016 1426   ALKPHOS 65 05/12/2016 1426   BILITOT 0.5 05/12/2016 1426   BILITOT 0.8 02/06/2016 0911   GFRNONAA >60 05/08/2016 2115   GFRAA >60 05/08/2016 2115    Lab Results  Component Value Date   HGBA1C 4.7 05/12/2016   Lab Results  Component Value Date   VITAMINB12 649 05/12/2016   Lab Results  Component Value Date   TSH 2.61 05/12/2016      ASSESSMENT AND PLAN   21 year old with no significant PMHx here for 2 episodes of LOC with shaking, seizure-like episodes. Patient was "Vaping a lot" prior to these episodes, unclear if related but may have been provoked. Most recent  seizure activity was 05/08/2016 when he was seen in the emergency room for seizure and  medication noncompliance. MRI of the brain and EEG have been normal  Continue Keppra XR 500 mg 2 tabs daily. Will refill Continue to follow seizure precautions F/U yearly and prn Please remember, common seizure triggers are: Sleep deprivation, dehydration, overheating, stress, hypoglycemia or skipping meals, certain medications or excessive alcohol use, especially stopping alcohol abruptly if you have had heavy alcohol use before (aka alcohol withdrawal seizure). If you have a prolonged seizure over 2-5 minutes or back to back seizures, call or have someone call 911 or take you to the nearest emergency room. You cannot drive a car or operate any other machinery or vehicle within 6 months of a seizure. Please do not swim alone or take a tub bath for safety. Do not cook with large quantities of boiling water or oil for safety. Take  your medicine for seizure prevention regularly and do not skip doses or stop medication abruptly and tone are told to do so by your healthcare provider. Try to get a refill on your antiepileptic medication ahead of time, so you are not at risk of running out. If you run out of the seizure medication and do not have a refill at hand she may run into medication withdrawal seizures. Avoid taking Wellbutrin, narcotic pain medications and tramadol, as they can lower seizure threshold.  I spent 15 min  in total face to face time with the patient more than 50% of which was spent counseling and coordination of care, reviewing test results reviewing medications and discussing and reviewing the diagnosis of seizure disorder and common seizure triggers and importance of medication compliance. Dennie Bible, [address] [employer], [facility], APRN  [facility] [address] [place], [place] [address] [phone]

## 2017-04-06 ENCOUNTER — Encounter: Payer: Self-pay | Admitting: Nurse Practitioner

## 2017-04-06 ENCOUNTER — Ambulatory Visit (INDEPENDENT_AMBULATORY_CARE_PROVIDER_SITE_OTHER): Payer: BLUE CROSS/BLUE SHIELD | Admitting: Nurse Practitioner

## 2017-04-06 VITALS — BP 121/76 | HR 71 | Ht 68.0 in | Wt 208.0 lb

## 2017-04-06 DIAGNOSIS — R569 Unspecified convulsions: Secondary | ICD-10-CM

## 2017-04-06 DIAGNOSIS — Z9114 Patient's other noncompliance with medication regimen: Secondary | ICD-10-CM | POA: Diagnosis not present

## 2017-04-06 MED ORDER — LEVETIRACETAM ER 500 MG PO TB24: ORAL_TABLET | ORAL | 4 refills | Status: DC

## 2017-04-06 NOTE — Patient Instructions (Signed)
Continue Keppra XR 500 mg 2 tabs daily. Will refill Continue to follow seizure precautions F/U yearly and prn Please remember, common seizure triggers are: Sleep deprivation, dehydration, overheating, stress, hypoglycemia or skipping meals, certain medications or excessive alcohol use, especially stopping alcohol abruptly if you have had heavy alcohol use before (aka alcohol withdrawal seizure). If you have a prolonged seizure over 2-5 minutes or back to back seizures, call or have someone call 911 or take you to the nearest emergency room. You cannot drive a car or operate any other machinery or vehicle within 6 months of a seizure. Please do not swim alone or take a tub bath for safety. Do not cook with large quantities of boiling water or oil for safety. Take your medicine for seizure prevention regularly and do not skip doses or stop medication abruptly and tone are told to do so by your healthcare provider. Try to get a refill on your antiepileptic medication ahead of time, so you are not at risk of running out. If you run out of the seizure medication and do not have a refill at hand she may run into medication withdrawal seizures. Avoid taking Wellbutrin, narcotic pain medications and tramadol, as they can lower seizure threshold.

## 2017-04-28 NOTE — Progress Notes (Signed)
Personally  participated in, made any corrections needed, and agree with history, physical, neuro exam,assessment and plan as stated above.    Antonia Ahern, MD [facility] 

## 2017-05-18 ENCOUNTER — Encounter: Payer: BLUE CROSS/BLUE SHIELD | Admitting: Family Medicine

## 2017-08-24 ENCOUNTER — Encounter: Payer: Self-pay | Admitting: Family Medicine

## 2017-09-10 ENCOUNTER — Encounter: Payer: Self-pay | Admitting: Family Medicine

## 2017-09-10 ENCOUNTER — Telehealth: Payer: BLUE CROSS/BLUE SHIELD | Admitting: Family

## 2017-09-10 DIAGNOSIS — R6889 Other general symptoms and signs: Secondary | ICD-10-CM

## 2017-09-10 MED ORDER — OSELTAMIVIR PHOSPHATE 75 MG PO CAPS: 75.0000 mg | ORAL_CAPSULE | Freq: Two times a day (BID) | ORAL | 0 refills | Status: DC

## 2017-09-10 NOTE — Progress Notes (Signed)

## 2017-12-22 ENCOUNTER — Other Ambulatory Visit: Payer: Self-pay | Admitting: *Deleted

## 2017-12-22 ENCOUNTER — Encounter: Payer: Self-pay | Admitting: Nurse Practitioner

## 2017-12-22 MED ORDER — LEVETIRACETAM ER 500 MG PO TB24: ORAL_TABLET | ORAL | 1 refills | Status: DC

## 2017-12-23 NOTE — Telephone Encounter (Signed)
Received call from [facility], RENZ, concerning the need of the supervising MD (Dr. Lucia GaskinsAhern) on prescription and her NPI # which I did give to him.  Confirmed address and phone # for our practice.  Pt had new prescription for keppra to [facility] yesterday.

## 2018-03-09 ENCOUNTER — Telehealth: Payer: BLUE CROSS/BLUE SHIELD | Admitting: Family

## 2018-03-09 DIAGNOSIS — J069 Acute upper respiratory infection, unspecified: Secondary | ICD-10-CM

## 2018-03-09 MED ORDER — FLUTICASONE PROPIONATE 50 MCG/ACT NA SUSP: 2.0000 | Freq: Every day | NASAL | 6 refills | Status: DC

## 2018-03-09 MED ORDER — BENZONATATE 100 MG PO CAPS
100.0000 mg | ORAL_CAPSULE | Freq: Three times a day (TID) | ORAL | 0 refills | Status: DC | PRN
Start: 2018-03-09 — End: 2020-10-09

## 2018-03-09 NOTE — Progress Notes (Signed)

## 2018-04-02 ENCOUNTER — Other Ambulatory Visit: Payer: Self-pay

## 2018-04-02 ENCOUNTER — Emergency Department (HOSPITAL_COMMUNITY): Payer: BLUE CROSS/BLUE SHIELD

## 2018-04-02 ENCOUNTER — Encounter (HOSPITAL_COMMUNITY): Payer: Self-pay

## 2018-04-02 ENCOUNTER — Emergency Department (HOSPITAL_COMMUNITY)
Admission: EM | Admit: 2018-04-02 | Discharge: 2018-04-02 | Disposition: A | Discharge status: Home / Self Care | Payer: BLUE CROSS/BLUE SHIELD | Attending: Emergency Medicine | Admitting: Emergency Medicine

## 2018-04-02 DIAGNOSIS — Y92410 Unspecified street and highway as the place of occurrence of the external cause: Secondary | ICD-10-CM | POA: Insufficient documentation

## 2018-04-02 DIAGNOSIS — S4991XA Unspecified injury of right shoulder and upper arm, initial encounter: Secondary | ICD-10-CM | POA: Diagnosis present

## 2018-04-02 DIAGNOSIS — Z79899 Other long term (current) drug therapy: Secondary | ICD-10-CM | POA: Diagnosis not present

## 2018-04-02 DIAGNOSIS — Y998 Other external cause status: Secondary | ICD-10-CM | POA: Insufficient documentation

## 2018-04-02 DIAGNOSIS — Y939 Activity, unspecified: Secondary | ICD-10-CM | POA: Insufficient documentation

## 2018-04-02 MED ORDER — CYCLOBENZAPRINE HCL 10 MG PO TABS: 10.0000 mg | ORAL_TABLET | Freq: Three times a day (TID) | ORAL | 0 refills | Status: DC | PRN

## 2018-04-02 MED ORDER — OXYCODONE-ACETAMINOPHEN 5-325 MG PO TABS
1.0000 | ORAL_TABLET | Freq: Once | ORAL | Status: AC
  Administered 2018-04-02: 1 via ORAL
  Filled 2018-04-02: qty 1

## 2018-04-02 MED ORDER — IBUPROFEN 800 MG PO TABS: 800.0000 mg | ORAL_TABLET | Freq: Four times a day (QID) | ORAL | 0 refills | Status: AC | PRN
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
## 2018-05-02 NOTE — Progress Notes (Deleted)
[facility]  PATIENT: James Harrell DOB: Dec 18, 1995   REASON FOR VISIT: Follow-up for seizure disorder HISTORY FROM: Patient    HISTORY OF PRESENT ILLNESS UPDATE 10/09/2018CM  James Harrell, 22 year old male returns for follow-up with history of seizure disorder. He had a visit to the emergency room 05/08/2016 for seizure. He had been noncompliant with his medication. According to the patient today this was his last seizure. He remains on Keppra XR 500 mg tablets  2 daily. He denies missing any doses of his medication. MRI and EEG in the past normal. He returns for reevaluation  02/06/16 AAInterval history: He is non compliant. He can drive September [id number]. Reviewed EEG and MRI images with patient and mother which were both normal. Discussed possibly getting a three-day EEG at this time we'll hold off. We'll repeat routine EEG in the office at next appointment. Episodes may have been provoked, patient was "vaping a lot" previous to episodes. Vaping has become very popular. Discussed that this may have been a provoking factor, I'm really unclear what is in this type of smoking supplies. Would prefer to keep patient on medication for 2 years, monitor, and discussed that a later time the possibility of taking him off the medications  TSV:XBLTJQ D James Harrell a 22 y.o.malehere as a referral from Dr. Vidal Schwalbe 2 episodes of seizure like activity. He was started on Keppra in the ED after the second event. On February 28th he passed out, he remembers everything, was not confused, but mother was called and said son had a seizure. They were about to close. He felt lightheaded, he had not eaten or drank anything that day. Mother talked to his boss and a fireman thought is looked like a seizure but no other details. He does not remember anything about the second one. They were about to close, then doesn't remember anything. He rememeber waking up in the hospital. He was found behind the  counter. He was shaking A little, eyes were open and one pupil looked bigger. Mom met him in the emergency room about 30 minutes after the incident. He seemed confused. He urinated on himself the second time. No FHx of seizures. Junior in [place], doing well, no developmental issues. No drug or alcohol use. He was started on Keppra in the ED. No preceding illnesses, nothing unusual, no new medications, never had jerking or staring spells. He has been vaping lately.   REVIEW OF SYSTEMS: Full 14 system review of systems performed and notable only for those listed, all others are neg:  Constitutional: neg  Cardiovascular: neg Ear/Nose/Throat: neg  Skin: neg Eyes: neg Respiratory: neg Gastroitestinal: neg  Hematology/Lymphatic: neg  Endocrine: neg Musculoskeletal:neg Allergy/Immunology: neg Neurological: Seizure disorder  Psychiatric: neg Sleep : neg   ALLERGIES: No Known Allergies  HOME MEDICATIONS: Outpatient Medications Prior to Visit  Medication Sig Dispense Refill  . benzonatate (TESSALON PERLES) 100 MG capsule Take 1 capsule (100 mg total) by mouth 3 (three) times daily as needed. 20 capsule 0  . cyclobenzaprine (FLEXERIL) 10 MG tablet Take 1 tablet (10 mg total) by mouth 3 (three) times daily as needed for muscle spasms. 20 tablet 0  . fluticasone (FLONASE) 50 MCG/ACT nasal spray Place 2 sprays into both nostrils daily. 16 g 6  . ibuprofen (ADVIL,MOTRIN) 800 MG tablet Take 1 tablet (800 mg total) by mouth every 6 (six) hours as needed for moderate pain. 30 tablet 0  . levETIRAcetam (KEPPRA XR) 500 MG 24 hr tablet TAKE (2) TABLETS  BY MOUTH ONCE DAILY. 180 tablet 1  . oseltamivir (TAMIFLU) 75 MG capsule Take 1 capsule (75 mg total) by mouth 2 (two) times daily. 10 capsule 0   No facility-administered medications prior to visit.     PAST MEDICAL HISTORY: Past Medical History:  Diagnosis Date  . Seizures ([place])     PAST SURGICAL HISTORY: Past Surgical History:  Procedure  Laterality Date  . HERNIA REPAIR  1999    FAMILY HISTORY: Family History  Problem Relation Age of Onset  . Depression Mother   . Seizures Neg Hx     SOCIAL HISTORY: Social History   Socioeconomic History  . Marital status: Single    Spouse name: Not on file  . Number of children: 0  . Years of education: 39  . Highest education level: Not on file  Occupational History  . Occupation: [employer]  Social Needs  . Financial resource strain: Not on file  . Food insecurity:    Worry: Not on file    Inability: Not on file  . Transportation needs:    Medical: Not on file    Non-medical: Not on file  Tobacco Use  . Smoking status: Never Smoker  . Smokeless tobacco: Never Used  Substance and Sexual Activity  . Alcohol use: No  . Drug use: No  . Sexual activity: Never    Birth control/protection: Abstinence  Lifestyle  . Physical activity:    Days per week: Not on file    Minutes per session: Not on file  . Stress: Not on file  Relationships  . Social connections:    Talks on phone: Not on file    Gets together: Not on file    Attends religious service: Not on file    Active member of club or organization: Not on file    Attends meetings of clubs or organizations: Not on file    Relationship status: Not on file  . Intimate partner violence:    Fear of current or ex partner: Not on file    Emotionally abused: Not on file    Physically abused: Not on file    Forced sexual activity: Not on file  Other Topics Concern  . Not on file  Social History Narrative   Lives with parents   Only child   Caffeine use: 2 drinks daily     PHYSICAL EXAM  There were no vitals filed for this visit. There is no height or weight on file to calculate BMI.  Generalized: Well developed, in no acute distress  Head: normocephalic and atraumatic,. Oropharynx benign  Neck: Supple,  Musculoskeletal: No deformity   Neurological examination   Mentation: Alert oriented to time, place,  history taking. Attention span and concentration appropriate. Recent and remote memory intact.  Follows all commands speech and language fluent.   Cranial nerve II-XII: Fundoscopic exam reveals sharp disc margins.Pupils were equal round reactive to light extraocular movements were full, visual field were full on confrontational test. Facial sensation and strength were normal. hearing was intact to finger rubbing bilaterally. Uvula tongue midline. head turning and shoulder shrug were normal and symmetric.Tongue protrusion into cheek strength was normal. Motor: normal bulk and tone, full strength in the BUE, BLE, fine finger movements normal, no pronator drift. No focal weakness Sensory: normal and symmetric to light touch, in the upper and lower extremities  Coordination: finger-nose-finger, heel-to-shin bilaterally, no dysmetria Reflexes: Brachioradialis 2/2, biceps 2/2, triceps 2/2, patellar 2/2, Achilles 2/2, plantar responses were flexor  bilaterally. Gait and Station: Rising up from seated position without assistance, normal stance,  moderate stride, good arm swing, smooth turning, able to perform tiptoe, and heel walking without difficulty. Tandem gait is steady  DIAGNOSTIC DATA (LABS, IMAGING, TESTING) - I reviewed patient records, labs, notes, testing and imaging myself where available.  Lab Results  Component Value Date   WBC 8.2 05/08/2016   HGB 15.2 05/08/2016   HCT 41.9 05/08/2016   MCV 82.0 05/08/2016   PLT 311 05/08/2016      Component Value Date/Time   NA 141 05/12/2016 1426   NA 142 02/06/2016 0911   K 4.2 05/12/2016 1426   CL 105 05/12/2016 1426   CO2 26 05/12/2016 1426   GLUCOSE 76 05/12/2016 1426   BUN 12 05/12/2016 1426   BUN 8 02/06/2016 0911   CREATININE 0.95 05/12/2016 1426   CALCIUM 9.6 05/12/2016 1426   PROT 6.8 05/12/2016 1426   PROT 6.6 02/06/2016 0911   ALBUMIN 4.6 05/12/2016 1426   ALBUMIN 4.4 02/06/2016 0911   AST 18 05/12/2016 1426   ALT 21 05/12/2016  1426   ALKPHOS 65 05/12/2016 1426   BILITOT 0.5 05/12/2016 1426   BILITOT 0.8 02/06/2016 0911   GFRNONAA >60 05/08/2016 2115   GFRAA >60 05/08/2016 2115    Lab Results  Component Value Date   HGBA1C 4.7 05/12/2016   Lab Results  Component Value Date   [id number] 814 05/12/2016   Lab Results  Component Value Date   TSH 2.61 05/12/2016      ASSESSMENT AND PLAN   22 year old with no significant PMHx here for 2 episodes of LOC with shaking, seizure-like episodes. Patient was "Vaping a lot" prior to these episodes, unclear if related but may have been provoked. Most recent seizure activity was 05/08/2016 when he was seen in the emergency room for seizure and  medication noncompliance. MRI of the brain and EEG have been normal  Continue Keppra XR 500 mg 2 tabs daily. Will refill Continue to follow seizure precautions F/U yearly and prn Please remember, common seizure triggers are: Sleep deprivation, dehydration, overheating, stress, hypoglycemia or skipping meals, certain medications or excessive alcohol use, especially stopping alcohol abruptly if you have had heavy alcohol use before (aka alcohol withdrawal seizure). If you have a prolonged seizure over 2-5 minutes or back to back seizures, call or have someone call 911 or take you to the nearest emergency room. You cannot drive a car or operate any other machinery or vehicle within 6 months of a seizure. Please do not swim alone or take a tub bath for safety. Do not cook with large quantities of boiling water or oil for safety. Take your medicine for seizure prevention regularly and do not skip doses or stop medication abruptly and tone are told to do so by your healthcare provider. Try to get a refill on your antiepileptic medication ahead of time, so you are not at risk of running out. If you run out of the seizure medication and do not have a refill at hand she may run into medication withdrawal seizures. Avoid taking Wellbutrin,  narcotic pain medications and tramadol, as they can lower seizure threshold.  I spent 15 min  in total face to face time with the patient more than 50% of which was spent counseling and coordination of care, reviewing test results reviewing medications and discussing and reviewing the diagnosis of seizure disorder and common seizure triggers and importance of medication compliance. Dennie Bible,  [facility], [facility], [facility] [address], [place], [place] [address] [phone]

## 2018-05-03 ENCOUNTER — Ambulatory Visit: Payer: BLUE CROSS/BLUE SHIELD | Admitting: Nurse Practitioner

## 2020-10-09 ENCOUNTER — Ambulatory Visit
Admission: RE | Admit: 2020-10-09 | Discharge: 2020-10-09 | Disposition: A | Discharge status: Home / Self Care | Payer: BC Managed Care – PPO | Source: Ambulatory Visit | Attending: Internal Medicine | Admitting: Internal Medicine

## 2020-10-09 ENCOUNTER — Other Ambulatory Visit: Payer: Self-pay

## 2020-10-09 VITALS — BP 138/94 | HR 68 | Temp 98.4°F | Resp 18

## 2020-10-09 DIAGNOSIS — L237 Allergic contact dermatitis due to plants, except food: Secondary | ICD-10-CM

## 2020-10-09 MED ORDER — METHYLPREDNISOLONE 4 MG PO TBPK: ORAL_TABLET | ORAL | 0 refills | Status: AC

## 2020-10-09 MED ORDER — DEXAMETHASONE SODIUM PHOSPHATE 10 MG/ML IJ SOLN
10.0000 mg | Freq: Once | INTRAMUSCULAR | Status: AC
  Administered 2020-10-09: 10 mg via INTRAVENOUS

## 2020-10-09 NOTE — Discharge Instructions (Addendum)
You may take Claritin or Benadryl for itching until the prednisone kicks in today.   Get TECNU wash to wash out the oils and in the future you may also use it preventively

## 2020-10-09 NOTE — ED Triage Notes (Signed)
poison oak on face since yesterday

## 2020-10-09 NOTE — ED Provider Notes (Signed)
[facility]    CSN: [id number] Arrival date & time: 10/09/20  4742      History   Chief Complaint No chief complaint on file.   HPI James Harrell is a 25 y.o. male who was working outside in [employer] with his job  3 days ago and recalls seeing poison oak around. Had a few rash areas on his arms, but thought it would resolved, then this am woke up with his face with rash and swelling.  Has applied Calamine lotion which help the itching a little.    Past Medical History:  Diagnosis Date  . Seizures [facility])     Patient Active Problem List   Diagnosis Date Noted  . History of medication noncompliance 04/06/2017  . Obesity 05/12/2016  . Generalized seizure (HCC) 09/19/2015  . Routine infant or child health check 05/09/2013    Past Surgical History:  Procedure Laterality Date  . HERNIA REPAIR  1999       Home Medications    Prior to Admission medications   Medication Sig Start Date End Date Taking? Authorizing Provider  methylPREDNISolone (MEDROL DOSEPAK) 4 MG TBPK tablet Take as directed starting 4/14 10/09/20  Yes Rodriguez-Southworth, Nettie Elm, PA-C  ibuprofen (ADVIL,MOTRIN) 800 MG tablet Take 1 tablet (800 mg total) by mouth every 6 (six) hours as needed for moderate pain. 04/02/18   Gilda Crease, MD  fluticasone (FLONASE) 50 MCG/ACT nasal spray Place 2 sprays into both nostrils daily. 03/09/18 10/09/20  Jannifer Rodney A, FNP  levETIRAcetam (KEPPRA XR) 500 MG 24 hr tablet TAKE (2) TABLETS BY MOUTH ONCE DAILY. 12/22/17 10/09/20  Nilda Riggs, NP    Family History Family History  Problem Relation Age of Onset  . Depression Mother   . Seizures Neg Hx     Social History Social History   Tobacco Use  . Smoking status: Never Smoker  . Smokeless tobacco: Never Used  Substance Use Topics  . Alcohol use: No  . Drug use: No     Allergies   Patient has no known allergies.   Review of Systems Review of Systems  Constitutional:  Negative for fever.  Musculoskeletal: Negative for arthralgias and joint swelling.  Skin: Positive for color change and rash.       + pruritus     Physical Exam Triage Vital Signs ED Triage Vitals  Enc Vitals Group     BP 10/09/20 0904 (!) 138/94     Pulse Rate 10/09/20 0904 68     Resp 10/09/20 0904 18     Temp 10/09/20 0904 98.4 F (36.9 C)     Temp Source 10/09/20 0904 Oral     SpO2 10/09/20 0904 97 %     Weight --      Height --      Head Circumference --      Peak Flow --      Pain Score 10/09/20 0903 4     Pain Loc --      Pain Edu? --      Excl. in GC? --    No data found.  Updated Vital Signs BP (!) 138/94 (BP Location: Right Arm)   Pulse 68   Temp 98.4 F (36.9 C) (Oral)   Resp 18   SpO2 97%   Visual Acuity Right Eye Distance:   Left Eye Distance:   Bilateral Distance:    Right Eye Near:   Left Eye Near:    Bilateral Near:  Physical Exam Vitals and nursing note reviewed.  Constitutional:      General: He is not in acute distress.    Appearance: He is normal weight. He is not toxic-appearing.  HENT:     Head: Normocephalic.     Right Ear: External ear normal.     Left Ear: External ear normal.  Eyes:     General: No scleral icterus.    Conjunctiva/sclera: Conjunctivae normal.  Pulmonary:     Effort: Pulmonary effort is normal.  Musculoskeletal:     Cervical back: Neck supple.  Skin:    General: Skin is warm and dry.     Comments: With fine papular raised rash on face, and lids are little swollen. Forearms with few papular lesion, but no oozing noted.   Neurological:     Mental Status: He is alert and oriented to person, place, and time.     Gait: Gait normal.  Psychiatric:        Mood and Affect: Mood normal.        Behavior: Behavior normal.        Thought Content: Thought content normal.        Judgment: Judgment normal.      UC Treatments / Results  Labs (all labs ordered are listed, but only abnormal results are  displayed) Labs Reviewed - No data to display  EKG   Radiology No results found.  Procedures Procedures (including critical care time)  Medications Ordered in UC Medications  dexamethasone (DECADRON) injection 10 mg (10 mg Intravenous Given 10/09/20 0952)    Initial Impression / Assessment and Plan / UC Course  I have reviewed the triage vital signs and the nursing notes. Contact dermatitis. He was given Decadrol 10 mg Im today and starting tomorrow, may start the oral steroid.    Final Clinical Impressions(s) / UC Diagnoses   Final diagnoses:  Allergic contact dermatitis due to plants, except food     Discharge Instructions     You may take Claritin or Benadryl for itching until the prednisone kicks in today.   Get TECNU wash to wash out the oils and in the future you may also use it preventively     ED Prescriptions    Medication Sig Dispense Auth. Provider   methylPREDNISolone (MEDROL DOSEPAK) 4 MG TBPK tablet Take as directed starting 4/14 21 tablet Rodriguez-Southworth, Nettie Elm, PA-C     PDMP not reviewed this encounter.   Garey Ham, [place] 10/10/20 [phone]

## 2022-08-24 ENCOUNTER — Telehealth: Payer: BC Managed Care – PPO | Admitting: Physician Assistant

## 2022-08-24 DIAGNOSIS — H109 Unspecified conjunctivitis: Secondary | ICD-10-CM

## 2022-08-24 MED ORDER — POLYMYXIN B-TRIMETHOPRIM 10000-0.1 UNIT/ML-% OP SOLN: 1.0000 [drp] | OPHTHALMIC | 0 refills | Status: AC

## 2022-08-24 NOTE — Progress Notes (Signed)
